# Patient Record
Sex: Male | Born: 1985 | Hispanic: No | Marital: Single | Smoking: Former smoker
Health system: Southern US, Community
[De-identification: ages and names within clinical notes are randomized; demographics above are authoritative.]

---

## 2011-02-19 ENCOUNTER — Emergency Department (HOSPITAL_COMMUNITY)
Admission: EM | Admit: 2011-02-19 | Discharge: 2011-02-19 | Disposition: A | Discharge status: Home / Self Care | Payer: Managed Care, Other (non HMO) | Attending: Emergency Medicine | Admitting: Emergency Medicine

## 2011-02-19 ENCOUNTER — Encounter (HOSPITAL_COMMUNITY): Payer: Self-pay

## 2011-02-19 DIAGNOSIS — Z0000 Encounter for general adult medical examination without abnormal findings: Secondary | ICD-10-CM

## 2011-02-19 DIAGNOSIS — Z0489 Encounter for examination and observation for other specified reasons: Secondary | ICD-10-CM | POA: Insufficient documentation

## 2011-02-19 NOTE — ED Provider Notes (Signed)
History     CSN: [id number]  Arrival date & time 02/19/11  1622   First MD Initiated Contact with Patient 02/19/11 1633      No chief complaint on file.   (Consider location/radiation/quality/duration/timing/severity/associated sxs/prior treatment) HPI  Patient presents to emergency department requesting a blood EtOH level to be drawn. Patient states he has a court appearance scheduled for tomorrow and per the court was ordered to have a blood alcohol level drawn. Patient has a paperwork showing that he needs a blood alcohol level however he does not paperwork for a court mandated drug alcohol level. Patient has no physical complaint and patient denies alcohol use.  No past medical history on file.  No past surgical history on file.  No family history on file.  History  Substance Use Topics  . Smoking status: Not on file  . Smokeless tobacco: Not on file  . Alcohol Use: Not on file      Review of Systems  Allergies  Review of patient's allergies indicates not on file.  Home Medications  No current outpatient prescriptions on file.  There were no vitals taken for this visit.  Physical Exam  Nursing note and vitals reviewed. Constitutional: He is oriented to person, place, and time. He appears well-developed and well-nourished.  Eyes: Conjunctivae are normal.  Cardiovascular: Normal rate.   Pulmonary/Chest: Effort normal.  Neurological: He is alert and oriented to person, place, and time.  Skin: Skin is warm and dry.  Psychiatric: He has a normal mood and affect.    ED Course  Procedures (including critical care time)  Labs Reviewed  ETHANOL - Abnormal; Notable for the following:    Alcohol, Ethyl (B) 15 (*)    All other components within normal limits   No results found.   1. Normal physical exam       MDM  Medical screening exam for lab draw. Patient has no complaint.         Jenness Corner, [place] 02/19/11 1726

## 2011-02-19 NOTE — ED Notes (Signed)
Here to get blood work for [employer].  Per court

## 2011-02-20 NOTE — ED Provider Notes (Signed)
Medical screening examination/treatment/procedure(s) were performed by non-physician practitioner and as supervising physician I was immediately available for consultation/collaboration.  Cyndra Numbers, MD 02/20/11 1235

## 2011-03-19 ENCOUNTER — Encounter (HOSPITAL_COMMUNITY): Payer: Self-pay | Admitting: Emergency Medicine

## 2011-03-19 ENCOUNTER — Emergency Department (HOSPITAL_COMMUNITY): Payer: 59

## 2011-03-19 ENCOUNTER — Emergency Department (HOSPITAL_COMMUNITY)
Admission: EM | Admit: 2011-03-19 | Discharge: 2011-03-19 | Disposition: A | Discharge status: Home Health | Payer: 59 | Attending: Emergency Medicine | Admitting: Emergency Medicine

## 2011-03-19 DIAGNOSIS — F17200 Nicotine dependence, unspecified, uncomplicated: Secondary | ICD-10-CM | POA: Insufficient documentation

## 2011-03-19 DIAGNOSIS — S01501A Unspecified open wound of lip, initial encounter: Secondary | ICD-10-CM | POA: Insufficient documentation

## 2011-03-19 DIAGNOSIS — S0180XA Unspecified open wound of other part of head, initial encounter: Secondary | ICD-10-CM | POA: Insufficient documentation

## 2011-03-19 DIAGNOSIS — S01511A Laceration without foreign body of lip, initial encounter: Secondary | ICD-10-CM

## 2011-03-19 DIAGNOSIS — W010XXA Fall on same level from slipping, tripping and stumbling without subsequent striking against object, initial encounter: Secondary | ICD-10-CM | POA: Insufficient documentation

## 2011-03-19 DIAGNOSIS — S0181XA Laceration without foreign body of other part of head, initial encounter: Secondary | ICD-10-CM

## 2011-03-19 MED ORDER — TETANUS-DIPHTH-ACELL PERTUSSIS 5-2.5-18.5 LF-MCG/0.5 IM SUSP
0.5000 mL | Freq: Once | INTRAMUSCULAR | Status: AC
  Administered 2011-03-19: 0.5 mL via INTRAMUSCULAR
  Filled 2011-03-19: qty 0.5

## 2011-03-19 NOTE — ED Notes (Signed)
Pt does not speak very good English but states that he fell on the ground and hit his lip.

## 2011-03-19 NOTE — ED Provider Notes (Signed)
Medical screening examination/treatment/procedure(s) were performed by non-physician practitioner and as supervising physician I was immediately available for consultation/collaboration.   Leigh-Ann Devion Chriscoe, MD 03/19/11 0823 

## 2011-03-19 NOTE — ED Provider Notes (Signed)
History     CSN: [id number]  Arrival date & time 03/19/11  0205   First MD Initiated Contact with Patient 03/19/11 0225      Chief Complaint  Patient presents with  . Facial Laceration     HPI  History provided by the patient. Patient is a 26 year old male with no significant past medical history. Patient is Arabic speaking with moderate English. Patient presents with complaints of laceration to lip after a fall. Patient states he was running and tripped landing on the sidewalk outside. He has a small laceration to his left lip with associated bleeding. Patient was using a rectal pressure and has helped with bleeding. He denies LOC. Patient does admit to alcohol use he getting around midnight. He denies any neck pains. He denies any other injury. Patient is unsure of last tetanus shot. Symptoms are reported to be mild.   History reviewed. No pertinent past medical history.  History reviewed. No pertinent past surgical history.  History reviewed. No pertinent family history.  History  Substance Use Topics  . Smoking status: Current Everyday Smoker  . Smokeless tobacco: Not on file  . Alcohol Use: Yes      Review of Systems  All other systems reviewed and are negative.    Allergies  Review of patient's allergies indicates no known allergies.  Home Medications  No current outpatient prescriptions on file.  BP 142/70  Pulse 99  Temp(Src) 98.4 F (36.9 C) (Oral)  Resp 16  Wt 145 lb 12.8 oz (66.134 kg)  SpO2 98%  Physical Exam  Nursing note and vitals reviewed. Constitutional: He is oriented to person, place, and time. He appears well-developed and well-nourished. No distress.  HENT:  Head: Normocephalic.  Mouth/Throat: Oropharynx is clear and moist.         6 mm laceration to left chin area below lip. 5 mm gaping wound to left upper lip at lateral aspect. No battle sign or raccoon eyes  Neck: Normal range of motion. Neck supple.       No cervical midline  tenderness.  Cardiovascular: Normal rate and regular rhythm.   No murmur heard. Pulmonary/Chest: Effort normal and breath sounds normal. No respiratory distress. He has no wheezes. He has no rales.  Musculoskeletal: He exhibits no edema and no tenderness.  Neurological: He is alert and oriented to person, place, and time.  Psychiatric: He has a normal mood and affect. His behavior is normal.    ED Course  Procedures   LACERATION REPAIR Performed by: Angus Seller Authorized by: Angus Seller Consent: Verbal consent obtained. Risks and benefits: risks, benefits and alternatives were discussed Consent given by: patient Patient identity confirmed: provided demographic data Prepped and Draped in normal sterile fashion Wound explored  Laceration Location: Left lateral upper lip  Laceration Length: 0.5 cm  No Foreign Bodies seen or palpated  Anesthesia: None   Irrigation method: syringe Amount of cleaning: standard  Skin closure: Lip mucosa with 4-0 Vicryl   Number of sutures: 1   Technique: Simple interrupted   Patient tolerance: Patient tolerated the procedure well with no immediate complications.  LACERATION REPAIR Performed by: Angus Seller Authorized by: Angus Seller Consent: Verbal consent obtained. Risks and benefits: risks, benefits and alternatives were discussed Consent given by: patient Patient identity confirmed: provided demographic data Prepped and Draped in normal sterile fashion Wound explored  Laceration Location: Left chin  Laceration Length: 0.6 cm  No Foreign Bodies seen or palpated  Anesthesia: None   Irrigation  method: syringe Amount of cleaning: standard  Skin closure: Skin with Dermabond    Patient tolerance: Patient tolerated the procedure well with no immediate complications.     Labs Reviewed - No data to display Dg Cervical Spine Complete  03/19/2011  *RADIOLOGY REPORT*  Clinical Data: Fall.  Facial lacerations.   CERVICAL SPINE - COMPLETE 4+ VIEW  Comparison: None.  Findings: Loss of normal cervical lordosis.  Prevertebral soft tissues are normal.  No fracture or subluxation.  Disc spaces are maintained.  IMPRESSION: Cervical straightening.  No bony abnormality.  Original Report Authenticated By: Cyndie Chime, M.D.     1. Lip laceration   2. Facial laceration       MDM  2:50 AM patient seen and evaluated. Patient in no acute distress.        Angus Seller, [place] 03/19/11 [phone]

## 2011-03-19 NOTE — Discharge Instructions (Signed)
You were seen and treated today for your injuries to her face. Her x-rays do not show any broken bones in your neck or other concerning injury. Your cuts were closed with an absorbable suture which will fall out on its own.     Absorbable Suture Repair Absorbable sutures (stitches) hold skin together so you can heal. Keep skin wounds clean and dry for the next 2 to 3 days. Then, you may gently wash your wound and dress it with an antibiotic ointment as recommended. As your wound begins to heal, the sutures are no longer needed, and they typically begin to fall off. This will take 7 to 10 days. After 10 days, if your sutures are loose, you can remove them by wiping with a clean gauze pad or a cotton ball. Do not pull your sutures out. They should wipe away easily. If after 10 days they do not easily wipe away, have your caregiver take them out. Absorbable sutures may be used deep in a wound to help hold it together. If these stitches are below the skin, the body will absorb them completely in 3 to 4 weeks.  You may need a tetanus shot if:  You cannot remember when you had your last tetanus shot.   You have never had a tetanus shot.  If you get a tetanus shot, your arm may swell, get red, and feel warm to the touch. This is common and not a problem. If you need a tetanus shot and you choose not to have one, there is a rare chance of getting tetanus. Sickness from tetanus can be serious. SEEK IMMEDIATE MEDICAL CARE IF:  You have redness in the wound area.   The wound area feels hot to the touch.   You develop swelling in the wound area.   You develop pain.   There is fluid drainage from the wound.  Document Released: 02/17/2004 Document Revised: 09/21/2010 Document Reviewed: 05/31/2010 [facility] Patient Information 2012 [place], [place].    Facial Laceration A facial laceration is a cut on the face. It can take 1 to 2 years for the scar to heal completely. HOME CARE  For stitches  (sutures):  Keep the cut clean and dry.   If you have a bandage (dressing), change it at least once a day. Change the bandage if it gets wet or dirty, or as told by your doctor.   Wash the cut with soap and water 2 times a day. Rinse the cut with water. Pat it dry with a clean towel.   Put a thin layer of medicated cream on the cut as told by your doctor.   You may shower after the first 24 hours. Do not soak the cut in water until the stitches are removed.   Only take medicines as told by your doctor.   Have your stitches removed as told by your doctor.   Do not wear makeup until a few days after your stitches are removed.  For skin adhesive strips:  Keep the cut clean and dry.   Do not get the strips wet. You may take a bath, but be careful to keep the cut dry.   If the cut gets wet, pat it dry with a clean towel.   The strips will fall off on their own. Do not remove the strips that are still stuck to the cut.  For wound glue:  You may shower or take baths. Do not soak or scrub the cut. Do not swim.  Avoid heavy sweating until the glue falls off on its own. After a shower or bath, pat the cut dry with a clean towel.   Do not put medicine or makeup on your cut until the glue falls off.   If you have a bandage, do not put tape over the glue.   Avoid lots of sunlight or tanning lamps until the glue falls off. Put sunscreen on the cut for the first year to reduce your scar.   The glue will fall off on its own. Do not pick at the glue.  You may need a tetanus shot if:  You cannot remember when you had your last tetanus shot.   You have never had a tetanus shot.  If you need a tetanus shot and you choose not to have one, you may get tetanus. Sickness from tetanus can be serious. GET HELP RIGHT AWAY IF:   Your cut gets red, painful, or puffy (swollen).   There is yellowish-white fluid (pus) coming from the cut.   You have chills or a fever.  MAKE SURE YOU:   Understand  these instructions.   Will watch your condition.   Will get help right away if you are not doing well or get worse.  Document Released: 06/28/2007 Document Revised: 09/21/2010 Document Reviewed: 07/05/2010 [facility] Patient Information 2012 [place], [place].

## 2011-03-19 NOTE — ED Notes (Addendum)
Small laceration noted underneath bottom lip on the left.  Denies hitting head.  States that he tripped over something.  No LOC.

## 2013-08-21 IMAGING — CR DG CERVICAL SPINE COMPLETE 4+V
5 series · 5 of 5 positions shown · non-contrast
Comparison: None.

CLINICAL DATA: Fall.  Facial lacerations.

CERVICAL SPINE - COMPLETE 4+ VIEW

[w cervical spine lat]
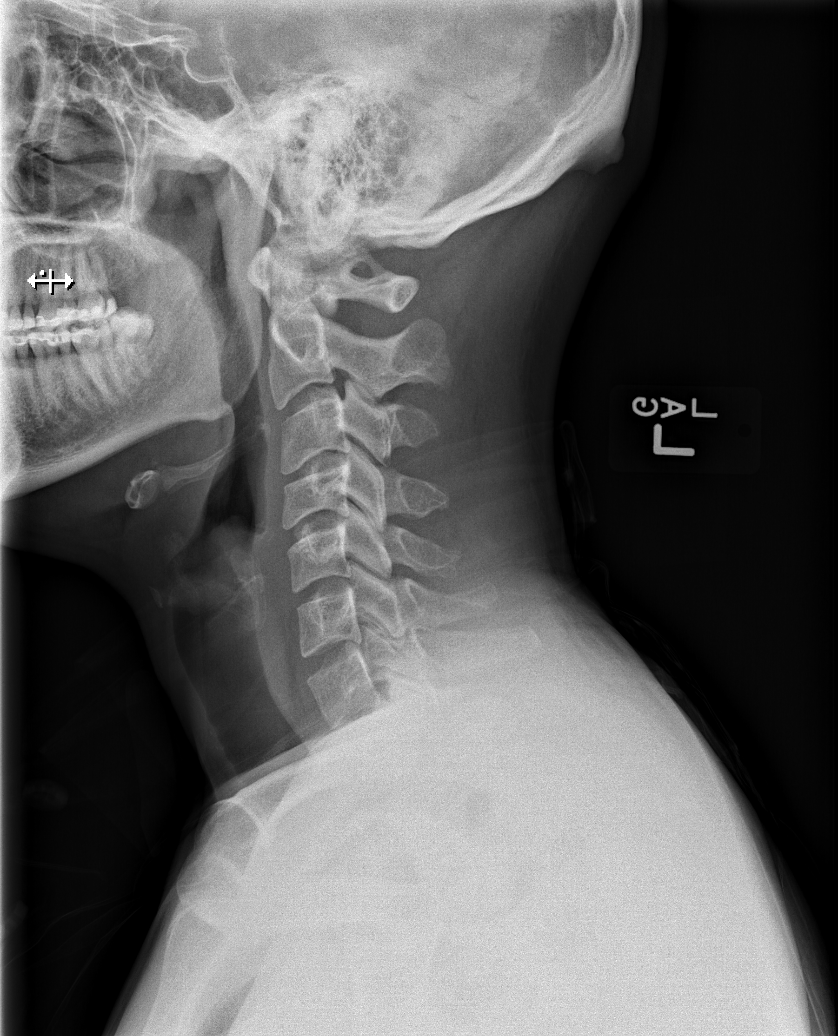

[w cervical spine ap_obl (1 of 2)]
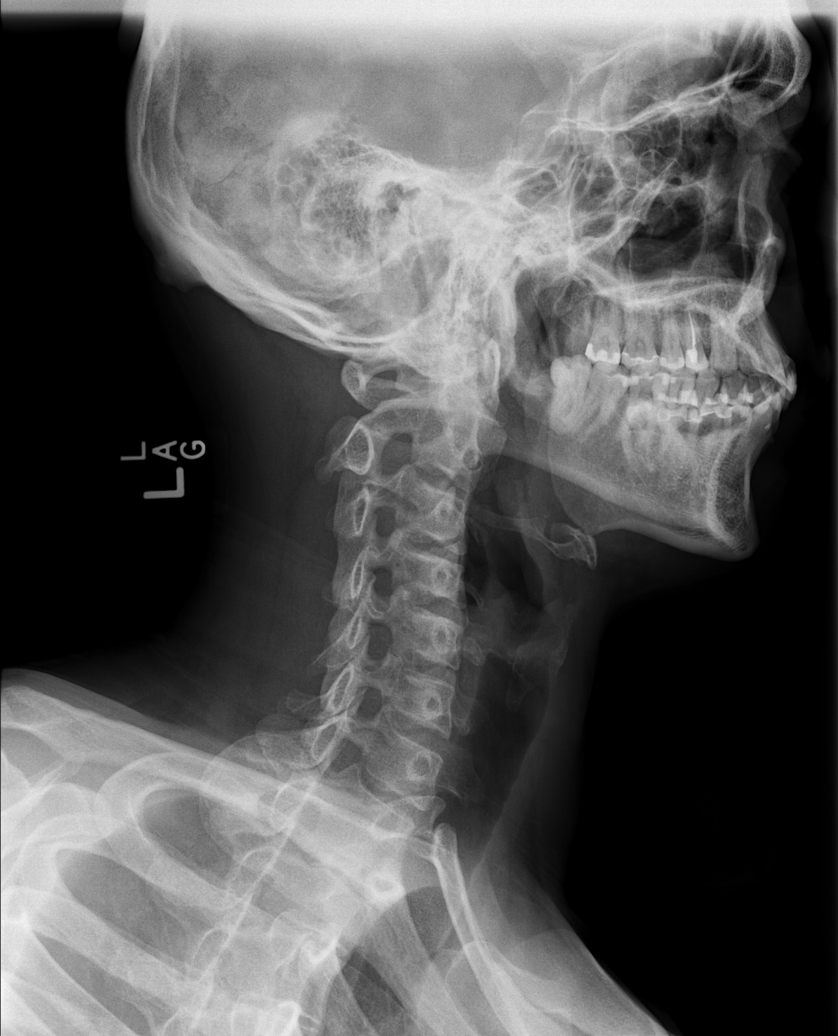

[w cervical spine ap_obl (2 of 2)]
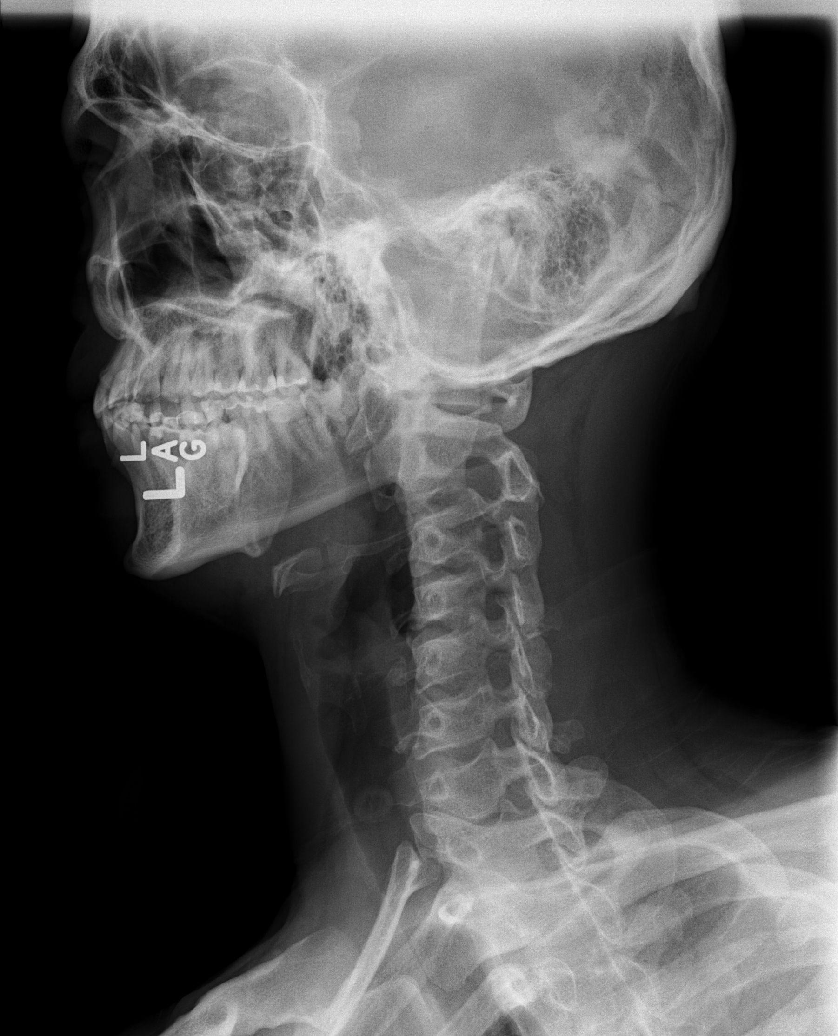

[w cervical spine ap]
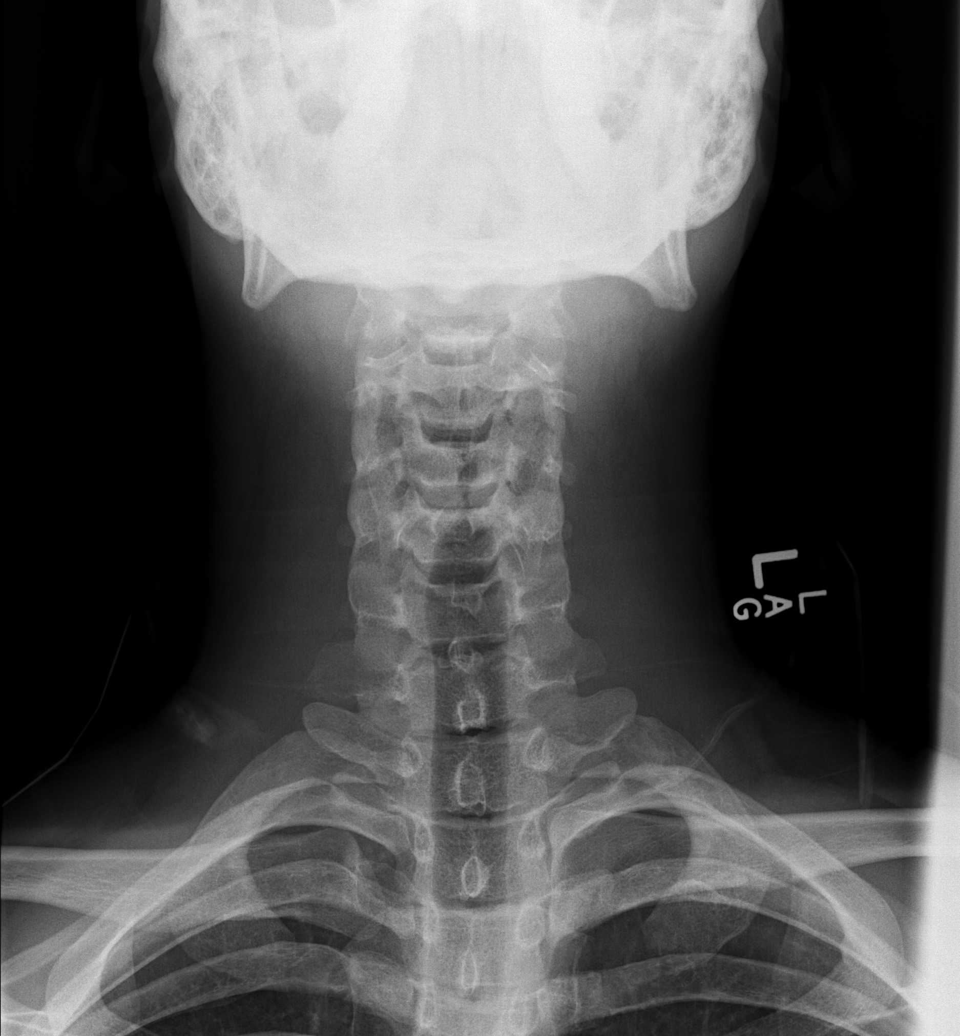

[w cervical spine odontoid]
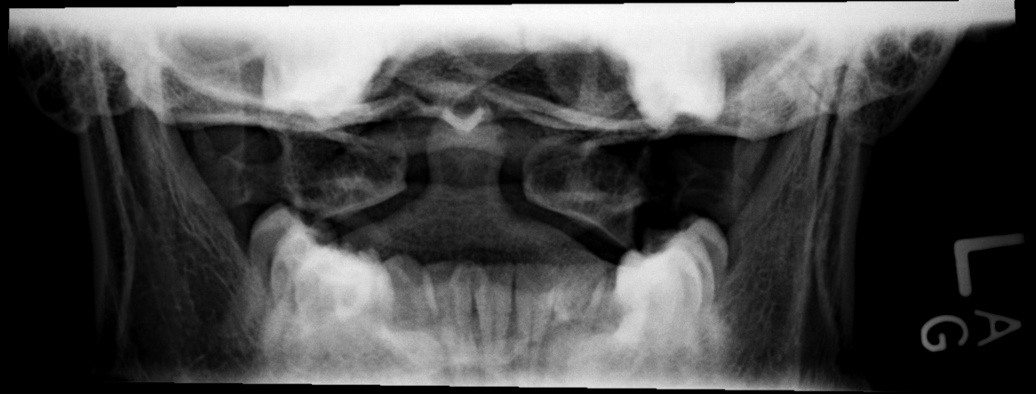

[5 of 5 positions shown; findings below may reference images not displayed]

FINDINGS: Loss of normal cervical lordosis.  Prevertebral soft
tissues are normal.  No fracture or subluxation.  Disc spaces are
maintained.
IMPRESSION: Cervical straightening.  No bony abnormality.

## 2015-07-09 ENCOUNTER — Ambulatory Visit (INDEPENDENT_AMBULATORY_CARE_PROVIDER_SITE_OTHER): Payer: PPO | Admitting: Family Medicine

## 2015-07-09 VITALS — BP 118/76 | HR 49 | Temp 98.4°F | Resp 15 | Ht 68.5 in | Wt 162.0 lb

## 2015-07-09 DIAGNOSIS — B029 Zoster without complications: Secondary | ICD-10-CM

## 2015-07-09 DIAGNOSIS — R21 Rash and other nonspecific skin eruption: Secondary | ICD-10-CM | POA: Diagnosis not present

## 2015-07-09 MED ORDER — TRIAMCINOLONE ACETONIDE 0.1 % EX CREA: 1.0000 "application " | TOPICAL_CREAM | Freq: Two times a day (BID) | CUTANEOUS | Status: AC

## 2015-07-09 MED ORDER — VALACYCLOVIR HCL 1 G PO TABS: 1000.0000 mg | ORAL_TABLET | Freq: Three times a day (TID) | ORAL | Status: AC

## 2015-07-09 MED ORDER — HYDROXYZINE PAMOATE 25 MG PO CAPS: 25.0000 mg | ORAL_CAPSULE | Freq: Three times a day (TID) | ORAL | Status: AC | PRN

## 2015-07-09 NOTE — Progress Notes (Addendum)
Subjective:  By signing my name below, I, Raven Small, attest that this documentation has been prepared under the direction and in the presence of Norberto Sorenson , MD.  Electronically Signed: Andrew Au, ED Scribe. 07/09/2015. 2:50 PM.   Patient ID: Brian Joseph, male    DOB: 1985-11-09, 30 y.o.   MRN: [id number]  HPI Chief Complaint  Patient presents with  . Mass    Right hip. x4days     HPI Comments: Brian Joseph is a 30 y.o. male who presents to the [facility] complaining of an itchy rash to right hip noticed 4 days ago. Pt denies area being painful. Pt has tried OTC cream witch has helped with the itchiness of rash. Pt unsure of hx of chicken pox. Pt denies nausea, bladder or bowel symptoms, weight changes, sweats, trouble sleeping and fatigue.   Pt does not have PCP.   There are no active problems to display for this patient.  No past medical history on file. No past surgical history on file. No Known Allergies Prior to Admission medications   Not on File   Social History   Social History  . Marital Status: Single    Spouse Name: N/A  . Number of Children: N/A  . Years of Education: N/A   Occupational History  . Not on file.   Social History Main Topics  . Smoking status: Former Games developer  . Smokeless tobacco: Not on file  . Alcohol Use: 0.0 oz/week    0 Standard drinks or equivalent per week  . Drug Use: No  . Sexual Activity: Not on file   Other Topics Concern  . Not on file   Social History Narrative    Review of Systems  Constitutional: Negative for fever, chills, diaphoresis, fatigue and unexpected weight change.  Gastrointestinal: Negative for nausea, vomiting, abdominal pain, diarrhea, constipation and blood in stool.  Genitourinary: Negative for dysuria, urgency, frequency, hematuria, decreased urine volume, enuresis and difficulty urinating.  Musculoskeletal: Negative for myalgias.  Skin: Positive for color change and rash.    Allergic/Immunologic: Negative for environmental allergies, food allergies and immunocompromised state.  Hematological: Negative for adenopathy. Does not bruise/bleed easily.  Psychiatric/Behavioral: Negative for sleep disturbance.       Objective:   Physical Exam  Constitutional: He is oriented to person, place, and time. He appears well-developed and well-nourished. No distress.  HENT:  Head: Normocephalic and atraumatic.  Eyes: Conjunctivae and EOM are normal.  Neck: Neck supple.  Cardiovascular: Normal rate.   Pulmonary/Chest: Effort normal.  Musculoskeletal: Normal range of motion.  Neurological: He is alert and oriented to person, place, and time.  Skin: Skin is warm and dry.  Erythematous reticular macular papular rash with fine scaling.   Psychiatric: He has a normal mood and affect. His behavior is normal.  Nursing note and vitals reviewed.  Filed Vitals:   07/09/15 1435  BP: 118/76  Pulse: 49  Temp: 98.4 F (36.9 C)  TempSrc: Oral  Resp: 15  Height: 5' 8.5" (1.74 m)  Weight: 162 lb (73.483 kg)  SpO2: 98%    Assessment & Plan:   1. Shingles rash   I suspect this is shingles from the dermatomal like distribution pattern. However, no vesicles or ulcers and it is itching rather than painful.  He is also young with no known underlying medical problem and does not think he ever had the chicken pox - unsure about vaccination status.   WIll cover for shingles with  antiviral. Other ddx is contact dermatitis - even a rhus dermatitis - but the isolated and covered location on his buttock decreases the liklihood - however, will cover with top TAC. Reminded pt not to occlud rash after app of top steroid - just cover with undergarments/clothing.  RTC if worsening.   Orders Placed This Encounter  Procedures  . Herpes simplex virus culture    Order Specific Question:  Source    Answer:  right buttock  . WOUND CULTURE    Order Specific Question:  Source    Answer:  right  buttock  . Varicella-zoster by PCR    Meds ordered this encounter  Medications  . valACYclovir (VALTREX) 1000 MG tablet    Sig: Take 1 tablet (1,000 mg total) by mouth 3 (three) times daily.    Dispense:  21 tablet    Refill:  0  . hydrOXYzine (VISTARIL) 25 MG capsule    Sig: Take 1 capsule (25 mg total) by mouth 3 (three) times daily as needed for itching.    Dispense:  30 capsule    Refill:  0  . triamcinolone cream (KENALOG) 0.1 %    Sig: Apply 1 application topically 2 (two) times daily. To right buttock rash    Dispense:  80 g    Refill:  0   I personally performed the services described in this documentation, which was scribed in my presence. The recorded information has been reviewed and considered, and addended by me as needed.   Norberto SorensonEva Shaw, M.D.  [facility]  [place] [address] [place], [place] [address] [phone] phone [phone] fax  07/09/2015 7:18 PM

## 2015-07-09 NOTE — Patient Instructions (Addendum)
   IF you received an x-ray today, you will receive an invoice from [facility]. Please contact [facility] at [phone] with questions or concerns regarding your invoice.   IF you received labwork today, you will receive an invoice from [facility]. Please contact [facility] at [phone] with questions or concerns regarding your invoice.   Our billing staff will not be able to assist you with questions regarding bills from these companies.  You will be contacted with the lab results as soon as they are available. The fastest way to get your results is to activate your My Chart account. Instructions are located on the last page of this paperwork. If you have not heard from us regarding the results in 2 weeks, please contact this office.    Shingles Shingles, which is also known as herpes zoster, is an infection that causes a painful skin rash and fluid-filled blisters. Shingles is not related to genital herpes, which is a sexually transmitted infection.   Shingles only develops in people who:  Have had chickenpox.  Have received the chickenpox vaccine. (This is rare.) CAUSES Shingles is caused by varicella-zoster virus (VZV). This is the same virus that causes chickenpox. After exposure to VZV, the virus stays in the body in an inactive (dormant) state. Shingles develops if the virus reactivates. This can happen many years after the initial exposure to VZV. It is not known what causes this virus to reactivate. RISK FACTORS People who have had chickenpox or received the chickenpox vaccine are at risk for shingles. Infection is more common in people who:  Are older than age 50.  Have a weakened defense (immune) system, such as those with HIV, AIDS, or cancer.  Are taking medicines that weaken the immune system, such as transplant medicines.  Are under great stress. SYMPTOMS Early symptoms of this condition include itching, tingling, and  pain in an area on your skin. Pain may be described as burning, stabbing, or throbbing. A few days or weeks after symptoms start, a painful red rash appears, usually on one side of the body in a bandlike or beltlike pattern. The rash eventually turns into fluid-filled blisters that break open, scab over, and dry up in about 2-3 weeks. At any time during the infection, you may also develop:  A fever.  Chills.  A headache.  An upset stomach. DIAGNOSIS This condition is diagnosed with a skin exam. Sometimes, skin or fluid samples are taken from the blisters before a diagnosis is made. These samples are examined under a microscope or sent to a lab for testing. TREATMENT There is no specific cure for this condition. Your health care provider will probably prescribe medicines to help you manage pain, recover more quickly, and avoid long-term problems. Medicines may include:  Antiviral drugs.  Anti-inflammatory drugs.  Pain medicines. If the area involved is on your face, you may be referred to a specialist, such as an eye doctor (ophthalmologist) or an ear, nose, and throat (ENT) doctor to help you avoid eye problems, chronic pain, or disability. HOME CARE INSTRUCTIONS Medicines  Take medicines only as directed by your health care provider.  Apply an anti-itch or numbing cream to the affected area as directed by your health care provider. Blister and Rash Care  Take a cool bath or apply cool compresses to the area of the rash or blisters as directed by your health care provider. This may help with pain and itching.  Keep your rash covered with a   loose bandage (dressing). Wear loose-fitting clothing to help ease the pain of material rubbing against the rash.  Keep your rash and blisters clean with mild soap and cool water or as directed by your health care provider.  Check your rash every day for signs of infection. These include redness, swelling, and pain that lasts or increases.  Do  not pick your blisters.  Do not scratch your rash. General Instructions  Rest as directed by your health care provider.  Keep all follow-up visits as directed by your health care provider. This is important.  Until your blisters scab over, your infection can cause chickenpox in people who have never had it or been vaccinated against it. To prevent this from happening, avoid contact with other people, especially:  Babies.  Pregnant women.  Children who have eczema.  Elderly people who have transplants.  People who have chronic illnesses, such as leukemia or AIDS. SEEK MEDICAL CARE IF:  Your pain is not relieved with prescribed medicines.  Your pain does not get better after the rash heals.  Your rash looks infected. Signs of infection include redness, swelling, and pain that lasts or increases. SEEK IMMEDIATE MEDICAL CARE IF:  The rash is on your face or nose.  You have facial pain, pain around your eye area, or loss of feeling on one side of your face.  You have ear pain or you have ringing in your ear.  You have loss of taste.  Your condition gets worse.   This information is not intended to replace advice given to you by your health care provider. Make sure you discuss any questions you have with your health care provider.   Document Released: 01/09/2005 Document Revised: 01/30/2014 Document Reviewed: 11/20/2013 Elsevier Interactive Patient Education 2016 Elsevier Inc[employer]   

## 2015-07-12 LAB — WOUND CULTURE
Gram Stain: NONE SEEN
Gram Stain: NONE SEEN
Gram Stain: NONE SEEN
ORGANISM ID, BACTERIA: NO GROWTH

## 2015-07-12 LAB — HERPES SIMPLEX VIRUS CULTURE: ORGANISM ID, BACTERIA: NOT DETECTED

## 2015-07-17 ENCOUNTER — Telehealth: Payer: Self-pay

## 2015-07-17 DIAGNOSIS — B029 Zoster without complications: Secondary | ICD-10-CM

## 2015-07-17 NOTE — Telephone Encounter (Signed)
Pt. Needs to come in for a lab only visit for blood work, he did not get his blood drawn at his last visit

## 2015-07-19 NOTE — Telephone Encounter (Signed)
If the test didn't get done at the last visit, there is no point in doing it now.  Hopefully his rash is gone in which case we can just cancel the order but if it is not, I would recommend recheck to decide if we still need the test or there likely would be a different course of action if it is still done.

## 2015-07-19 NOTE — Telephone Encounter (Signed)
Tried to call pt. No answer nor a vm to leave a message

## 2015-07-28 NOTE — Telephone Encounter (Signed)
Unable to leave message on phone.

## 2015-07-30 NOTE — Telephone Encounter (Signed)
Pt called back and he stated that he did not have the labs done at his visit.  He stated that the rash went away but came back and he started to use the cream again it has started to fade now.  He was advised to call to schedule an appt with Dr. Clelia CroftShaw for further eval  Pt stated that he will set this appt up.

## 2015-07-30 NOTE — Telephone Encounter (Signed)
LMOVM to call clinic.  Called Brother to see if there was a work number - only patient's cell number.

## 2015-11-27 ENCOUNTER — Ambulatory Visit (INDEPENDENT_AMBULATORY_CARE_PROVIDER_SITE_OTHER): Payer: PPO | Admitting: Osteopathic Medicine

## 2015-11-27 VITALS — BP 120/48 | HR 69 | Temp 97.8°F | Resp 16 | Ht 68.5 in | Wt 172.0 lb

## 2015-11-27 DIAGNOSIS — M26622 Arthralgia of left temporomandibular joint: Secondary | ICD-10-CM | POA: Diagnosis not present

## 2015-11-27 DIAGNOSIS — H60392 Other infective otitis externa, left ear: Secondary | ICD-10-CM | POA: Diagnosis not present

## 2015-11-27 MED ORDER — CIPROFLOXACIN-DEXAMETHASONE 0.3-0.1 % OT SUSP: 4.0000 [drp] | Freq: Two times a day (BID) | OTIC | 0 refills | Status: AC

## 2015-11-27 NOTE — Patient Instructions (Addendum)
Your left ear canal looks red and irritated, I am going to treat this for an infection. I would avoid using cotton swabs to clean the ear. Try the ear drops for one week, or until pain goes away. If pain does not get better in one week, or if it gets worse sooner than that, please come back to the clinic for further evaluation. I recommend use of over-the-counter anti-inflammatory medications such as Tylenol (maximum 1000 mg every 6 hours) or Ibuprofen (maximum 800 mg every 6 hours), this will help the ear and jaw pain.   Please call us if you have any questions!     IF you received an x-ray today, you will receive an invoice from [facility]. Please contact [facility] at [phone] with questions or concerns regarding your invoice.   IF you received labwork today, you will receive an invoice from [facility]. Please contact [facility] at [phone] with questions or concerns regarding your invoice.   Our billing staff will not be able to assist you with questions regarding bills from these companies.  You will be contacted with the lab results as soon as they are available. The fastest way to get your results is to activate your My Chart account. Instructions are located on the last page of this paperwork. If you have not heard from us regarding the results in 2 weeks, please contact this office.

## 2015-11-27 NOTE — Progress Notes (Signed)
HPI: Brian Joseph is a 30 y.o. male  who [facility] to [facility] today, 11/27/15,  for chief complaint of:  Chief Complaint  Patient presents with  . Ear Pain    LEFT EAR X 1 DAY  . Jaw Pain    Left side when opening mouth    Acute Illness/Pain  . Context: no injury, no dental problems or recent procedures, no swimming   . Location/quality: L ear pain hurst in the canal, and jaw popping . Duration: 1 day . Timing: constant but worse with touch . Modifying factors: no home meds tried. . Assoc signs/symptoms: no fever, (+)L jaw joint sore    Past medical, surgical, social and family history reviewed: No past medical history on file. No past surgical history on file. Social History  Substance Use Topics  . Smoking status: Former Games developermoker  . Smokeless tobacco: Not on file  . Alcohol use 0.0 oz/week   No family history on file.   Current medication list and allergy/intolerance information reviewed:   Current Outpatient Prescriptions  Medication Sig Dispense Refill  . hydrOXYzine (VISTARIL) 25 MG capsule Take 1 capsule (25 mg total) by mouth 3 (three) times daily as needed for itching. (Patient not taking: Reported on 11/27/2015) 30 capsule 0  . triamcinolone cream (KENALOG) 0.1 % Apply 1 application topically 2 (two) times daily. To right buttock rash (Patient not taking: Reported on 11/27/2015) 80 g 0  . valACYclovir (VALTREX) 1000 MG tablet Take 1 tablet (1,000 mg total) by mouth 3 (three) times daily. (Patient not taking: Reported on 11/27/2015) 21 tablet 0   No current facility-administered medications for this visit.    No Known Allergies    Review of Systems:  Constitutional:  No  fever, no chills, No recent illness, No unintentional weight changes. No significant fatigue.   HEENT: No  headache, no vision change, no hearing change, No sore throat, No  sinus pressure, +ear and jaw pain as per HPI  Cardiac: No  chest pain  Respiratory:  No   shortness of breath.   Exam:  BP (!) 120/48   Pulse 69   Temp 97.8 F (36.6 C) (Oral)   Resp 16   Ht 5' 8.5" (1.74 m)   Wt 172 lb (78 kg)   SpO2 97%   BMI 25.77 kg/m   Constitutional: VS see above. General Appearance: alert, well-developed, well-nourished, NAD  Eyes: Normal lids and conjunctive, non-icteric sclera  Ears, Nose, Mouth, Throat: MMM, Normal external inspection ears/nares/mouth/lips/gums. Left TM normal but canal (+)erythema. Pharynx/tonsils no erythema, no exudate. TMJ on L no popping. No L adenopathy preauricular, submandibular or anterior cervical. No L sided dental disease noted  Neck: No masses, trachea midline. No thyroid enlargement. No tenderness/mass appreciated. No lymphadenopathy  Respiratory: Normal respiratory effort.    Skin: warm, dry, intact. No rash/ulcer.      ASSESSMENT/PLAN:   Infective otitis externa of left ear - Plan: ciprofloxacin-dexamethasone (CIPRODEX) otic suspension  Arthralgia of left temporomandibular joint  Patient Instructions   Your left ear canal looks red and irritated, I am going to treat this for an infection. I would avoid using cotton swabs to clean the ear. Try the ear drops for one week, or until pain goes away. If pain does not get better in one week, or if it gets worse sooner than that, please come back to the clinic for further evaluation. I recommend use of over-the-counter anti-inflammatory medications such as Tylenol (maximum 1000 mg  every 6 hours) or Ibuprofen (maximum 800 mg every 6 hours), this will help the ear and jaw pain.   Please call us if you have any questions!     Visit summary with medication list and pertinent instructions was printed for patient to review. All questions at time of visit were answered - patient instructed to contact office with any additional concerns. ER/RTC precautions were reviewed with the patient. Follow-up plan: Return if symptoms worsen or fail to improve.
